# Patient Record
Sex: Female | Born: 1975 | Race: Black or African American | Hispanic: No | Marital: Married | Smoking: Never smoker
Health system: Southern US, Community
[De-identification: ages and names within clinical notes are randomized; demographics above are authoritative.]

## PROBLEM LIST (undated history)

## (undated) DIAGNOSIS — H409 Unspecified glaucoma: Secondary | ICD-10-CM

## (undated) HISTORY — DX: Unspecified glaucoma: H40.9

---

## 2011-12-10 ENCOUNTER — Emergency Department (HOSPITAL_COMMUNITY)
Admission: EM | Admit: 2011-12-10 | Discharge: 2011-12-10 | Disposition: A | Discharge status: Home / Self Care | Payer: Self-pay | Attending: Emergency Medicine | Admitting: Emergency Medicine

## 2011-12-10 ENCOUNTER — Encounter (HOSPITAL_COMMUNITY): Payer: Self-pay | Admitting: Emergency Medicine

## 2011-12-10 DIAGNOSIS — H409 Unspecified glaucoma: Secondary | ICD-10-CM | POA: Insufficient documentation

## 2011-12-10 MED ORDER — FLUORESCEIN SODIUM 1 MG OP STRP
ORAL_STRIP | OPHTHALMIC | Status: AC
  Filled 2011-12-10: qty 1

## 2011-12-10 MED ORDER — TETRACAINE HCL 0.5 % OP SOLN
2.0000 [drp] | Freq: Once | OPHTHALMIC | Status: DC
  Filled 2011-12-10: qty 2

## 2011-12-10 MED ORDER — OXYCODONE-ACETAMINOPHEN 5-325 MG PO TABS
2.0000 | ORAL_TABLET | Freq: Once | ORAL | Status: AC
  Administered 2011-12-10: 2 via ORAL
  Filled 2011-12-10: qty 2

## 2011-12-10 MED ORDER — DORZOLAMIDE HCL-TIMOLOL MAL 2-0.5 % OP SOLN: 1.0000 [drp] | Freq: Two times a day (BID) | OPHTHALMIC | Status: AC

## 2011-12-10 NOTE — Discharge Instructions (Signed)
Glaucoma  Glaucoma is a condition of high pressure inside the eyes. There are several forms of glaucoma. The pressure in the eye is raised because fluid (aqueous) within the eye can not get out through the normal drainage system. Below are the different forms of glaucoma.   Chronic open-angle glaucoma. This is when the fluid in the eye cannot get through the drainage system. It usually affects both eyes. The best way to understand chronic open angle glaucoma is to think of the tires on your car. When they are over-inflated with air, the driver is not aware that there is a problem. However, the rubber on the tires is being worn out faster than normal. Similarly, chronic high pressure in the eyes causes the nerves of the retina and optic nerve to be damaged. When this happens slowly and over time, there are no symptoms.   Closed angle glaucoma or acute angle closure glaucoma. This is when the fluid in the eye cannot get to the drainage system. The pressure in the eye rises very high (3 to 4 times normal). This causes extreme pain and vision loss in the affected eye. Attacks are sudden and often happen in one eye at a time. However, they can happen in both at the same time. If there is an attack of acute angle closure in one eye, the risk of an attack in the other eye is very high.   Primary glaucoma. This term is used when glaucoma is not caused by another disease.  CAUSES   Chronic Open Angle Glaucoma  This form is not usually related to other diseases (primary). However, there are forms of this type of glaucoma that are related to other diseases (secondary) including:   Congenital glaucoma. This means that you are born with the disease.   Certain diseases of the eye (inflammation, some types of cataracts and lens changes, having too much pigment in the eyes, certain diseases of the cornea and tumors of the eye).   Certain drugs and eye drops.   Traumatic injuries.   Abnormal blood vessels that form as a  result of other diseases, especially diabetes.   Certain other diseases in the body.  Acute Angle Closure Glaucoma    Far-sightedness (hyperopia). The eyes are usually shorter in length and have a narrower anatomic opening to the drainage system inside the eye   Drugs that cause dilation of the pupils as a side affect. Always check the label of drugs for warnings about the risk of glaucoma with their use.   Certain eye drops that can enlarge (dilate) the pupils. This is true even with drops used in the doctors office to look at your eyes.  SYMPTOMS   Chronic Open Angle Glaucoma  Early in the disease there are no symptoms at all. If left untreated, the disease will get worse. The following symptoms may happen:    Loss of side vision - usually near the nose. This symptom is rarely noticed by people with chronic open angle glaucoma. By the time this happens, the disease is usually well advanced.   Steady loss of side vision in all directions progressing to "tunnel vision." This is like looking through a toilet roll tube and only being able to see what is in the center of whatever you are looking at.   Eventual total loss of vision in the affected eye(s)  Acute Angle Closure Glaucoma   Severe pain in the affected eye associated with clouded vision.   Severe headache in   the area around the eye.   Feeling sick to your stomach (nausea) and throwing up (vomiting).  DIAGNOSIS   Primary Chronic Open Angle Glaucoma   This form of glaucoma has no symptoms until very late in the disease. Therefore, it is detected only during an eye exam by an eye specialist. It is important to have your eyes looked at by a specialist at least once a year after the age of 40. If the pressure in the eye is high, it does not necessarily mean that there is glaucoma. In such cases, an ophthalmologist may choose to follow the eye pressures carefully over a period of time. They may not begin treatment until other signs of actual damage  appear.   Some of the secondary open angle glaucomas do cause symptoms. In congenital glaucoma for instance, new born babies eyes can be enlarged. The baby may cry all of the time due to the pain of the increased pressure.   Acute Angle Closure Glaucoma   People with this type of glaucoma need to get help right away when the attack comes on because it is so painful. The diagnosis is made by an eye exam by an ophthalmologist. He or she will measure the pressure in the affected eye(s).  TREATMENT   Primary Chronic Open Angle Glaucoma    Eye drops and medicine by mouth (oral), alone or in combination, may be given. This depends on the how bad the disease is and the degree of damage.   Laser treatments may help in more severe cases.   Surgery may be needed.  Acute Angle Closure Glaucoma is treated very aggressively. The longer the attack lasts, the greater the chance of permanent vision loss.    Generally, it is important to get the pressure under control within 24 hours. This is to avoid permanent damage to the affected eye.   Powerful eye drop medication may be used as often as every 10 minutes.   Medicine given by mouth, injection or through the vein (intravenously). These medicines are used to both open the blockage to the eye's drainage system and to lessen the production of the fluid inside the eye.   After pressure is controlled and to avoid future attacks in both the same eye and the unaffected eye, surgery is needed to make a permanent small opening in the colored portion of the eye (iris). This allows the aqueous fluid to have lasting access to the eye's internal drainage system. This surgery can be done in the hospital, or it can be performed in an outpatient setting using a laser.  SEEK MEDICAL CARE IF:    You are over 40 years of age and have never had your eye pressure measured.   You have sudden, severe pain in one eye associated with drop in vision.   You have a headache, feel sick to your  stomach (nausea) and throw up (vomit).  SEEK IMMEDIATE MEDICAL CARE IF:   You develop severe pain in the affected eye.   You have problems with your vision.   You have a bad headache in the area around the eye.   You develop nausea and vomiting.   You have the same or similar symptoms in the other eye.  Document Released: 09/01/2005 Document Revised: 08/21/2011 Document Reviewed: 04/20/2008  [employer] Patient Information 2012 [employer].

## 2011-12-10 NOTE — ED Notes (Signed)
Patient presents stating she has had redness and pain to her left eye for the past 3 days.  Has a history of glaucoma and has just moved here from [place].

## 2011-12-10 NOTE — ED Provider Notes (Signed)
History     CSN: [id number]  Arrival date & time 12/10/11  1811   First MD Initiated Contact with Patient 12/10/11 2151      Chief Complaint  Patient presents with  . Eye Pain    history of glaucoma    (Consider location/radiation/quality/duration/timing/severity/associated sxs/prior treatment) HPI Comments: Patient complains of a 3 day history of redness and worsening pain to her left eye. She is also starting to have some blurry vision in her left eye. She describes a dull, aching, initially 3 days ago, and it's gradually gotten worse since then. 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 This was treated in [place]. She took eyedrops for about 2-3 months and then hasn't needed any treatment for them. She has not had any other eye problems. She complains of a dull bifrontal-type headache and pain and pressure around her left eye. Denies any dizziness denies a numbness or weakness in her extremities or any other complaints.  Patient is a 36 y.o. female presenting with eye pain. The history is provided by the patient.  Eye Pain This is a new problem. The current episode started 2 days ago. Associated symptoms include headaches. Pertinent negatives include no chest pain, no abdominal pain and no shortness of breath.    Past Medical History  Diagnosis Date  . Glaucoma     History reviewed. No pertinent past surgical history.  History reviewed. No pertinent family history.  History  Substance Use Topics  . Smoking status: Never Smoker   . Smokeless tobacco: Not on file  . Alcohol Use: Yes    OB History    Grav Para Term Preterm Abortions TAB SAB Ect Mult Living                  Review of Systems  Constitutional: Negative for fever, chills, diaphoresis and fatigue.  HENT: Negative for congestion, rhinorrhea and sneezing.   Eyes: Positive for pain, redness and visual disturbance.  Respiratory: Negative for cough, chest tightness  and shortness of breath.   Cardiovascular: Negative for chest pain and leg swelling.  Gastrointestinal: Negative for nausea, vomiting, abdominal pain, diarrhea and blood in stool.  Genitourinary: Negative for frequency, hematuria, flank pain and difficulty urinating.  Musculoskeletal: Negative for back pain and arthralgias.  Skin: Negative for rash.  Neurological: Positive for headaches. Negative for dizziness, speech difficulty, weakness and numbness.    Allergies  Review of patient's allergies indicates no known allergies.  Home Medications   Current Outpatient Rx  Name Route Sig Dispense Refill  . DORZOLAMIDE HCL-TIMOLOL MAL 22.3-6.8 MG/ML OP SOLN Left Eye Place 1 drop into the left eye 2 (two) times daily. 10 mL 12    BP 112/75  Pulse 84  Temp(Src) 97.7 F (36.5 C) (Oral)  Resp 20  SpO2 100%  Physical Exam  Constitutional: She is oriented to person, place, and time. She appears well-developed and well-nourished.  HENT:  Head: Normocephalic and atraumatic.  Eyes: Lids are normal. Pupils are equal, round, and reactive to light. Left eye exhibits no discharge and no exudate. No foreign body present in the left eye. Left conjunctiva is injected. Left eye exhibits normal extraocular motion and no nystagmus.  Slit lamp exam:      The left eye shows no corneal abrasion, no corneal ulcer, no foreign body, no hyphema and no fluorescein uptake.       Left eye is red, pressure with tonopen was 28  Neck: Normal range of motion. Neck supple.  Cardiovascular: Normal rate, regular rhythm and normal heart sounds.   Pulmonary/Chest: Effort normal and breath sounds normal. No respiratory distress. She has no wheezes. She has no rales. She exhibits no tenderness.  Abdominal: Soft. Bowel sounds are normal. There is no tenderness. There is no rebound and no guarding.  Musculoskeletal: Normal range of motion. She exhibits no edema.  Lymphadenopathy:    She has no cervical adenopathy.    Neurological: She is alert and oriented to person, place, and time.  Skin: Skin is warm and dry. No rash noted.  Psychiatric: She has a normal mood and affect.    ED Course  Procedures (including critical care time)  Labs Reviewed - No data to display No results found.   1. Glaucoma       MDM  Spoke with Dr. Allyne Gee with ophthamology who recommends starting cosopt eye drops and he will see her in the office tomorrow.        Rolan Bucco, MD 12/10/11 [phone]

## 2011-12-10 NOTE — ED Notes (Signed)
History of glaucoma reports pain onset 3 days ago

## 2012-03-24 ENCOUNTER — Encounter: Payer: Self-pay | Admitting: Obstetrics and Gynecology

## 2013-08-08 ENCOUNTER — Ambulatory Visit: Payer: Commercial Managed Care - PPO | Attending: Internal Medicine

## 2016-12-30 ENCOUNTER — Ambulatory Visit (INDEPENDENT_AMBULATORY_CARE_PROVIDER_SITE_OTHER): Payer: Self-pay | Admitting: Physician Assistant

## 2017-04-28 ENCOUNTER — Other Ambulatory Visit: Payer: Self-pay | Admitting: Surgery

## 2017-04-28 DIAGNOSIS — G453 Amaurosis fugax: Secondary | ICD-10-CM

## 2017-05-05 ENCOUNTER — Ambulatory Visit
Admission: RE | Admit: 2017-05-05 | Discharge: 2017-05-05 | Disposition: A | Discharge status: Home / Self Care | Payer: Self-pay | Source: Ambulatory Visit | Attending: Surgery | Admitting: Surgery

## 2017-05-05 DIAGNOSIS — G453 Amaurosis fugax: Secondary | ICD-10-CM

## 2017-05-05 MED ORDER — GADOBENATE DIMEGLUMINE 529 MG/ML IV SOLN
13.0000 mL | Freq: Once | INTRAVENOUS | Status: AC | PRN
  Administered 2017-05-05: 13 mL via INTRAVENOUS

## 2017-10-07 ENCOUNTER — Other Ambulatory Visit: Payer: Self-pay

## 2017-10-07 ENCOUNTER — Emergency Department (HOSPITAL_COMMUNITY)
Admission: EM | Admit: 2017-10-07 | Discharge: 2017-10-08 | Disposition: A | Discharge status: Home / Self Care | Payer: BLUE CROSS/BLUE SHIELD | Attending: Emergency Medicine | Admitting: Emergency Medicine

## 2017-10-07 ENCOUNTER — Encounter (HOSPITAL_COMMUNITY): Payer: Self-pay | Admitting: Emergency Medicine

## 2017-10-07 ENCOUNTER — Emergency Department (HOSPITAL_COMMUNITY): Payer: BLUE CROSS/BLUE SHIELD

## 2017-10-07 ENCOUNTER — Ambulatory Visit (HOSPITAL_COMMUNITY)
Admission: EM | Admit: 2017-10-07 | Discharge: 2017-10-07 | Disposition: A | Discharge status: Home / Self Care | Payer: BLUE CROSS/BLUE SHIELD | Attending: Family Medicine | Admitting: Family Medicine

## 2017-10-07 DIAGNOSIS — R1013 Epigastric pain: Secondary | ICD-10-CM | POA: Diagnosis not present

## 2017-10-07 DIAGNOSIS — R079 Chest pain, unspecified: Secondary | ICD-10-CM

## 2017-10-07 DIAGNOSIS — R0600 Dyspnea, unspecified: Secondary | ICD-10-CM

## 2017-10-07 DIAGNOSIS — R0609 Other forms of dyspnea: Secondary | ICD-10-CM | POA: Diagnosis not present

## 2017-10-07 LAB — BASIC METABOLIC PANEL
Anion gap: 11 (ref 5–15)
BUN: 13 mg/dL (ref 6–20)
CHLORIDE: 104 mmol/L (ref 101–111)
CO2: 24 mmol/L (ref 22–32)
Calcium: 9.3 mg/dL (ref 8.9–10.3)
Creatinine, Ser: 0.97 mg/dL (ref 0.44–1.00)
GFR calc Af Amer: >60 mL/min (ref >60)
GFR calc non Af Amer: >60 mL/min (ref >60)
GLUCOSE: 87 mg/dL (ref 65–99)
POTASSIUM: 4 mmol/L (ref 3.5–5.1)
Sodium: 139 mmol/L (ref 135–145)

## 2017-10-07 LAB — CBC
HEMATOCRIT: 43.2 % (ref 36.0–46.0)
Hemoglobin: 14.4 g/dL (ref 12.0–15.0)
MCH: 27.9 pg (ref 26.0–34.0)
MCHC: 33.3 g/dL (ref 30.0–36.0)
MCV: 83.7 fL (ref 78.0–100.0)
Platelets: 312 10*3/uL (ref 150–400)
RBC: 5.16 MIL/uL — ABNORMAL HIGH (ref 3.87–5.11)
RDW: 12.4 % (ref 11.5–15.5)
WBC: 6.8 10*3/uL (ref 4.0–10.5)

## 2017-10-07 LAB — I-STAT TROPONIN, ED: Troponin i, poc: 0 ng/mL (ref 0.00–0.08)

## 2017-10-07 LAB — I-STAT BETA HCG BLOOD, ED (MC, WL, AP ONLY)

## 2017-10-07 MED ORDER — ASPIRIN 81 MG PO CHEW
324.0000 mg | CHEWABLE_TABLET | Freq: Once | ORAL | Status: AC
  Administered 2017-10-07: 324 mg via ORAL

## 2017-10-07 MED ORDER — ASPIRIN 81 MG PO CHEW
CHEWABLE_TABLET | ORAL | Status: AC
  Filled 2017-10-07: qty 4

## 2017-10-07 NOTE — ED Notes (Signed)
Pt remains in waiting room. Updated on wait for treatment room. 

## 2017-10-07 NOTE — ED Notes (Signed)
Lab work, radiology results and vital signs reviewed, no critical results at this time, no change in acuity indicated.  

## 2017-10-07 NOTE — ED Provider Notes (Signed)
[facility]    CSN: [id number] Arrival date & time: 10/07/17  1731     History   Chief Complaint Chief Complaint  Patient presents with  . Chest Pain    HPI Amber Robinson is a 42 y.o. female.   42 year old female, with no significant past medical history, presenting today complaining of chest pain.  Patient states that she has had intermittent chest pain over the past 3 days.  States the chest pain is substernal and radiates through to her back.  States that she has some intermittent shortness of breath as well as nausea associated with the pain.  Has also noticed dyspnea on exertion over the past 3 days.  She denies any leg swelling, hemoptysis, palpitations.   The history is provided by the patient.  Chest Pain  Pain location:  Substernal area Pain quality: aching   Pain radiates to:  Mid back Pain severity:  Moderate Onset quality:  Gradual Duration:  3 days Timing:  Intermittent Progression:  Unchanged Chronicity:  New Context: at rest   Context: not breathing, not drug use and not eating   Relieved by:  Nothing Worsened by:  Nothing Ineffective treatments:  None tried Associated symptoms: abdominal pain (chest pain radiates to the epigastric region), back pain, dizziness, nausea and shortness of breath   Associated symptoms: no AICD problem, no altered mental status, no anorexia, no anxiety, no claudication, no cough, no diaphoresis, no dysphagia, no fatigue, no fever, no headache, no heartburn, no lower extremity edema, no numbness, no orthopnea, no palpitations, no PND, no syncope, no vomiting and no weakness   Risk factors: no birth control, no coronary artery disease, no diabetes mellitus, no Ehlers-Danlos syndrome, no high cholesterol, no hypertension, no immobilization, not female and no Marfan's syndrome     Past Medical History:  Diagnosis Date  . Glaucoma     There are no active problems to display for this patient.   History reviewed.  No pertinent surgical history.  OB History    No data available       Home Medications    Prior to Admission medications   Not on File    Family History History reviewed. No pertinent family history.  Social History Social History   Tobacco Use  . Smoking status: Never Smoker  . Smokeless tobacco: Never Used  Substance Use Topics  . Alcohol use: Yes  . Drug use: No     Allergies   Patient has no known allergies.   Review of Systems Review of Systems  Constitutional: Negative for chills, diaphoresis, fatigue and fever.  HENT: Negative for ear pain, sore throat and trouble swallowing.   Eyes: Negative for pain and visual disturbance.  Respiratory: Positive for shortness of breath. Negative for cough.   Cardiovascular: Positive for chest pain. Negative for palpitations, orthopnea, claudication, syncope and PND.  Gastrointestinal: Positive for abdominal pain (chest pain radiates to the epigastric region) and nausea. Negative for anorexia, heartburn and vomiting.  Genitourinary: Negative for dysuria and hematuria.  Musculoskeletal: Positive for back pain. Negative for arthralgias.  Skin: Negative for color change and rash.  Neurological: Positive for dizziness. Negative for seizures, syncope, weakness, numbness and headaches.  All other systems reviewed and are negative.    Physical Exam Triage Vital Signs ED Triage Vitals [10/07/17 1753]  Enc Vitals Group     BP 114/78     Pulse Rate 78     Resp 20     Temp 97.6 F (36.4  C)     Temp Source Oral     SpO2 100 %     Weight      Height      Head Circumference      Peak Flow      Pain Score      Pain Loc      Pain Edu?      Excl. in [place]?    No data found.  Updated Vital Signs BP 114/78 (BP Location: Left Arm)   Pulse 78   Temp 97.6 F (36.4 C) (Oral)   Resp 20   LMP 09/30/2017   SpO2 100%   Visual Acuity Right Eye Distance:   Left Eye Distance:   Bilateral Distance:    Right Eye Near:   Left  Eye Near:    Bilateral Near:     Physical Exam  Constitutional: She appears well-developed and well-nourished. No distress.  HENT:  Head: Normocephalic and atraumatic.  Eyes: Conjunctivae are normal.  Neck: Neck supple.  Cardiovascular: Normal rate, regular rhythm and normal pulses.  No extrasystoles are present. PMI is not displaced.  No murmur heard. Pulmonary/Chest: Effort normal and breath sounds normal. No stridor. No respiratory distress. She has no decreased breath sounds. She has no wheezes. She has no rhonchi. She has no rales.  Abdominal: Soft. There is no tenderness.  Musculoskeletal: She exhibits no edema.  Neurological: She is alert.  Skin: Skin is warm and dry.  Psychiatric: She has a normal mood and affect.  Nursing note and vitals reviewed.    UC Treatments / Results  Labs (all labs ordered are listed, but only abnormal results are displayed) Labs Reviewed - No data to display  EKG  EKG Interpretation None       Radiology No results found.  Procedures Procedures (including critical care time)  Medications Ordered in UC Medications  aspirin chewable tablet 324 mg (324 mg Oral Given 10/07/17 1807)     Initial Impression / Assessment and Plan / UC Course  I have reviewed the triage vital signs and the nursing notes.  Pertinent labs & imaging results that were available during my care of the patient were reviewed by me and considered in my medical decision making (see chart for details).     Chest pain with shortness of breath and nausea EKG shows NSR Patient sent to the ED for further eval   Final Clinical Impressions(s) / UC Diagnoses   Final diagnoses:  Chest pain, unspecified type  DOE (dyspnea on exertion)    ED Discharge Orders    None       Controlled Substance Prescriptions [place] Controlled Substance Registry consulted? Not Applicable   Phebe Colla, [place] 10/07/17 7948

## 2017-10-07 NOTE — ED Triage Notes (Signed)
Pt to ER sent from [facility] for further cardiac evaluation - states lower central chest pain radiating to back x3 days with dizziness. States palpitations in throat. NAD. No medical hx.

## 2017-10-07 NOTE — ED Notes (Signed)
Updated on room status.

## 2017-10-07 NOTE — ED Triage Notes (Signed)
PT C/O: intermittent CP/epigastic pain onset 3 days that radiates to the back.... Reports sx increases when slouching over  Sx also include headaches, blurred vision, nauseas   Had an ekg last month due to blurred vision  DENIES: diaphoresis, dyspnea/SOB... Also denies pain at the moment.   TAKING MEDS: advil   A&O x4... NAD... Ambulatory

## 2017-10-08 LAB — HEPATIC FUNCTION PANEL
ALT: 11 U/L — ABNORMAL LOW (ref 14–54)
AST: 17 U/L (ref 15–41)
Albumin: 4 g/dL (ref 3.5–5.0)
Alkaline Phosphatase: 60 U/L (ref 38–126)
Bilirubin, Direct: <0.1 mg/dL — ABNORMAL LOW (ref 0.1–0.5)
TOTAL PROTEIN: 7.1 g/dL (ref 6.5–8.1)
Total Bilirubin: 0.4 mg/dL (ref 0.3–1.2)

## 2017-10-08 LAB — LIPASE, BLOOD: LIPASE: 29 U/L (ref 11–51)

## 2017-10-08 MED ORDER — GI COCKTAIL ~~LOC~~
30.0000 mL | Freq: Once | ORAL | Status: AC
  Administered 2017-10-08: 30 mL via ORAL
  Filled 2017-10-08: qty 30

## 2017-10-08 MED ORDER — SUCRALFATE 1 G PO TABS: 1.0000 g | ORAL_TABLET | Freq: Three times a day (TID) | ORAL | 0 refills | Status: DC

## 2017-10-08 MED ORDER — OMEPRAZOLE 20 MG PO CPDR: 20.0000 mg | DELAYED_RELEASE_CAPSULE | Freq: Every day | ORAL | 0 refills | Status: DC

## 2017-10-08 NOTE — ED Provider Notes (Signed)
[facility] EMERGENCY DEPARTMENT Provider Note   CSN: [id number] Arrival date & time: 10/07/17  1825     History   Chief Complaint Chief Complaint  Patient presents with  . Chest Pain    HPI Amber Robinson is a 42 y.o. female.  Patient presents to the ED with a chief complaint of epigastric pain.  Onset 3 days ago. She states that she has had some burning in her throat and chest.  She states that it also radiates to her back.  She denies any post-prandial pain.  She states that she has had some SOB.  She denies smoking or eating spicy or greasy foods.  She states that she does drink coffee daily.  She tried taking some TUMs with no relief.  Additionally, she reports that she has had some vertigo symptoms, for which she is being worked up by her doctor and recently had a negative MRI.   The history is provided by the patient. No language interpreter was used.    Past Medical History:  Diagnosis Date  . Glaucoma     There are no active problems to display for this patient.   History reviewed. No pertinent surgical history.  OB History    No data available       Home Medications    Prior to Admission medications   Not on File    Family History History reviewed. No pertinent family history.  Social History Social History   Tobacco Use  . Smoking status: Never Smoker  . Smokeless tobacco: Never Used  Substance Use Topics  . Alcohol use: Yes  . Drug use: No     Allergies   Patient has no known allergies.   Review of Systems Review of Systems  All other systems reviewed and are negative.    Physical Exam Updated Vital Signs BP 102/74   Pulse 70   Temp 98.4 F (36.9 C) (Oral)   Resp 16   LMP 09/30/2017   SpO2 100%   Physical Exam  Constitutional: She is oriented to person, place, and time. She appears well-developed and well-nourished.  HENT:  Head: Normocephalic and atraumatic.  Eyes: Conjunctivae and EOM are  normal. Pupils are equal, round, and reactive to light.  Neck: Normal range of motion. Neck supple.  Cardiovascular: Normal rate and regular rhythm. Exam reveals no gallop and no friction rub.  No murmur heard. Pulmonary/Chest: Effort normal and breath sounds normal. No respiratory distress. She has no wheezes. She has no rales. She exhibits no tenderness.  Abdominal: Soft. Bowel sounds are normal. She exhibits no distension and no mass. There is no tenderness. There is no rebound and no guarding.  No focal abdominal tenderness, no RLQ tenderness or pain at McBurney's point, no RUQ tenderness or Murphy's sign, no left-sided abdominal tenderness, no fluid wave, or signs of peritonitis   Musculoskeletal: Normal range of motion. She exhibits no edema or tenderness.  Neurological: She is alert and oriented to person, place, and time.  Skin: Skin is warm and dry.  Psychiatric: She has a normal mood and affect. Her behavior is normal. Judgment and thought content normal.  Nursing note and vitals reviewed.    ED Treatments / Results  Labs (all labs ordered are listed, but only abnormal results are displayed) Labs Reviewed  CBC - Abnormal; Notable for the following components:      Result Value   RBC 5.16 (*)    All other components within normal limits  BASIC METABOLIC PANEL  LIPASE, BLOOD  HEPATIC FUNCTION PANEL  I-STAT TROPONIN, ED  I-STAT BETA HCG BLOOD, ED ([facility], [facility], [facility] ONLY)    EKG  EKG Interpretation None      ED ECG REPORT  I personally interpreted this EKG   Date: 10/08/2017   Rate: 79  Rhythm: normal sinus rhythm  QRS Axis: right  Intervals: normal  ST/T Wave abnormalities: normal  Conduction Disutrbances:none  Narrative Interpretation:   Old EKG Reviewed: none available   Radiology Dg Chest 2 View  Result Date: 10/07/2017 CLINICAL DATA:  42 year old female with a history of epigastric and chest pain EXAM: CHEST  2 VIEW COMPARISON:  None. FINDINGS: The heart size  and mediastinal contours are within normal limits. Both lungs are clear. The visualized skeletal structures are unremarkable. IMPRESSION: No radiographic evidence of acute cardiopulmonary disease Electronically Signed   By: Corrie Mckusick D.O.   On: 10/07/2017 19:20    Procedures Procedures (including critical care time)  Medications Ordered in ED Medications  gi cocktail (Maalox,Lidocaine,Donnatal) (not administered)     Initial Impression / Assessment and Plan / ED Course  I have reviewed the triage vital signs and the nursing notes.  Pertinent labs & imaging results that were available during my care of the patient were reviewed by me and considered in my medical decision making (see chart for details).    Patient with epigastric pain and pain in her chest that radiates to her back.  This is been ongoing for several days.  She describes this as a burning sensation.  She tried taking Tums without relief.  Laboratory workup is reassuring.  Troponin is negative.  Electrolytes and blood counts are normal.  Chest x-ray is negative.  I have a low suspicion for ACS, or PE.    Vital signs are stable.  She is not tachycardic nor hypoxic.  Final Clinical Impressions(s) / ED Diagnoses   Final diagnoses:  Epigastric pain    ED Discharge Orders        Ordered    omeprazole (PRILOSEC) 20 MG capsule  Daily     10/08/17 0110    sucralfate (CARAFATE) 1 g tablet  3 times daily with meals & bedtime     10/08/17 0110       Montine Circle, PA-C 10/08/17 0622    Ward, Delice Bison, DO 10/08/17 [phone]

## 2020-06-25 DIAGNOSIS — Z01419 Encounter for gynecological examination (general) (routine) without abnormal findings: Secondary | ICD-10-CM | POA: Diagnosis not present

## 2020-06-25 DIAGNOSIS — Z0000 Encounter for general adult medical examination without abnormal findings: Secondary | ICD-10-CM | POA: Diagnosis not present

## 2020-06-25 DIAGNOSIS — Z124 Encounter for screening for malignant neoplasm of cervix: Secondary | ICD-10-CM | POA: Diagnosis not present

## 2020-06-25 DIAGNOSIS — Z30431 Encounter for routine checking of intrauterine contraceptive device: Secondary | ICD-10-CM | POA: Diagnosis not present

## 2020-06-25 DIAGNOSIS — Z8739 Personal history of other diseases of the musculoskeletal system and connective tissue: Secondary | ICD-10-CM | POA: Diagnosis not present

## 2020-06-25 DIAGNOSIS — Z1151 Encounter for screening for human papillomavirus (HPV): Secondary | ICD-10-CM | POA: Diagnosis not present

## 2020-08-03 DIAGNOSIS — Z1231 Encounter for screening mammogram for malignant neoplasm of breast: Secondary | ICD-10-CM | POA: Diagnosis not present

## 2021-01-21 ENCOUNTER — Encounter: Payer: Self-pay | Admitting: Internal Medicine

## 2021-01-21 ENCOUNTER — Ambulatory Visit (INDEPENDENT_AMBULATORY_CARE_PROVIDER_SITE_OTHER): Payer: 59 | Admitting: Internal Medicine

## 2021-01-21 ENCOUNTER — Other Ambulatory Visit: Payer: Self-pay

## 2021-01-21 VITALS — BP 110/70 | HR 89 | Temp 99.2°F | Ht 64.0 in | Wt 161.2 lb

## 2021-01-21 DIAGNOSIS — Z0001 Encounter for general adult medical examination with abnormal findings: Secondary | ICD-10-CM | POA: Diagnosis not present

## 2021-01-21 DIAGNOSIS — K219 Gastro-esophageal reflux disease without esophagitis: Secondary | ICD-10-CM | POA: Diagnosis not present

## 2021-01-21 DIAGNOSIS — Z0000 Encounter for general adult medical examination without abnormal findings: Secondary | ICD-10-CM | POA: Insufficient documentation

## 2021-01-21 LAB — COMPREHENSIVE METABOLIC PANEL
ALT: 10 U/L (ref 0–35)
AST: 19 U/L (ref 0–37)
Albumin: 4.4 g/dL (ref 3.5–5.2)
Alkaline Phosphatase: 73 U/L (ref 39–117)
BUN: 11 mg/dL (ref 6–23)
CO2: 26 mEq/L (ref 19–32)
Calcium: 9.4 mg/dL (ref 8.4–10.5)
Chloride: 105 mEq/L (ref 96–112)
Creatinine, Ser: 0.77 mg/dL (ref 0.40–1.20)
GFR: 94.04 mL/min (ref >60.00)
Glucose, Bld: 77 mg/dL (ref 70–99)
Potassium: 3.9 mEq/L (ref 3.5–5.1)
Sodium: 137 mEq/L (ref 135–145)
Total Bilirubin: 0.3 mg/dL (ref 0.2–1.2)
Total Protein: 7.5 g/dL (ref 6.0–8.3)

## 2021-01-21 LAB — CBC
HCT: 43 % (ref 36.0–46.0)
Hemoglobin: 14.2 g/dL (ref 12.0–15.0)
MCHC: 33 g/dL (ref 30.0–36.0)
MCV: 83 fl (ref 78.0–100.0)
Platelets: 270 10*3/uL (ref 150.0–400.0)
RBC: 5.18 Mil/uL — ABNORMAL HIGH (ref 3.87–5.11)
RDW: 13.5 % (ref 11.5–15.5)
WBC: 6.5 10*3/uL (ref 4.0–10.5)

## 2021-01-21 LAB — LIPID PANEL
Cholesterol: 135 mg/dL (ref 0–200)
HDL: 45.1 mg/dL (ref >39.00)
LDL Cholesterol: 79 mg/dL (ref 0–99)
NonHDL: 90.31
Total CHOL/HDL Ratio: 3
Triglycerides: 56 mg/dL (ref 0.0–149.0)
VLDL: 11.2 mg/dL (ref 0.0–40.0)

## 2021-01-21 LAB — HEMOGLOBIN A1C: Hgb A1c MFr Bld: 5.2 % (ref 4.6–6.5)

## 2021-01-21 LAB — TSH: TSH: 1.69 u[IU]/mL (ref 0.35–4.50)

## 2021-01-21 MED ORDER — FAMOTIDINE 40 MG PO TABS: 40.0000 mg | ORAL_TABLET | Freq: Every day | ORAL | 3 refills | Status: DC

## 2021-01-21 NOTE — Assessment & Plan Note (Signed)
Flu shot counseled yearly. Covid-19 3 shots. P. Tetanus up to date. Mammogram up to date with gyn, pap smear up to date with gyn. Counseled about sun safety and mole surveillance. Counseled about the dangers of distracted driving. Given 10 year screening recommendations.

## 2021-01-21 NOTE — Patient Instructions (Addendum)
We have sent in pepcid to take daily for 2 weeks and then see how your stomach is doing.    Health Maintenance, Female Adopting a healthy lifestyle and getting preventive care are important in promoting health and wellness. Ask your health care provider about:  The right schedule for you to have regular tests and exams.  Things you can do on your own to prevent diseases and keep yourself healthy. What should I know about diet, weight, and exercise? Eat a healthy diet  Eat a diet that includes plenty of vegetables, fruits, low-fat dairy products, and lean protein.  Do not eat a lot of foods that are high in solid fats, added sugars, or sodium.   Maintain a healthy weight Body mass index (BMI) is used to identify weight problems. It estimates body fat based on height and weight. Your health care provider can help determine your BMI and help you achieve or maintain a healthy weight. Get regular exercise Get regular exercise. This is one of the most important things you can do for your health. Most adults should:  Exercise for at least 150 minutes each week. The exercise should increase your heart rate and make you sweat (moderate-intensity exercise).  Do strengthening exercises at least twice a week. This is in addition to the moderate-intensity exercise.  Spend less time sitting. Even light physical activity can be beneficial. Watch cholesterol and blood lipids Have your blood tested for lipids and cholesterol at 44 years of age, then have this test every 5 years. Have your cholesterol levels checked more often if:  Your lipid or cholesterol levels are high.  You are older than 44 years of age.  You are at high risk for heart disease. What should I know about cancer screening? Depending on your health history and family history, you may need to have cancer screening at various ages. This may include screening for:  Breast cancer.  Cervical cancer.  Colorectal cancer.  Skin  cancer.  Lung cancer. What should I know about heart disease, diabetes, and high blood pressure? Blood pressure and heart disease  High blood pressure causes heart disease and increases the risk of stroke. This is more likely to develop in people who have high blood pressure readings, are of African descent, or are overweight.  Have your blood pressure checked: ? Every 3-5 years if you are 26-83 years of age. ? Every year if you are 61 years old or older. Diabetes Have regular diabetes screenings. This checks your fasting blood sugar level. Have the screening done:  Once every three years after age 64 if you are at a normal weight and have a low risk for diabetes.  More often and at a younger age if you are overweight or have a high risk for diabetes. What should I know about preventing infection? Hepatitis B If you have a higher risk for hepatitis B, you should be screened for this virus. Talk with your health care provider to find out if you are at risk for hepatitis B infection. Hepatitis C Testing is recommended for:  Everyone born from [birth year] through 1964.  Anyone with known risk factors for hepatitis C. Sexually transmitted infections (STIs)  Get screened for STIs, including gonorrhea and chlamydia, if: ? You are sexually active and are younger than 44 years of age. ? You are older than 44 years of age and your health care provider tells you that you are at risk for this type of infection. ? Your sexual activity  has changed since you were last screened, and you are at increased risk for chlamydia or gonorrhea. Ask your health care provider if you are at risk.  Ask your health care provider about whether you are at high risk for HIV. Your health care provider may recommend a prescription medicine to help prevent HIV infection. If you choose to take medicine to prevent HIV, you should first get tested for HIV. You should then be tested every 3 months for as long as you are taking  the medicine. Pregnancy  If you are about to stop having your period (premenopausal) and you may become pregnant, seek counseling before you get pregnant.  Take 400 to 800 micrograms (mcg) of folic acid every day if you become pregnant.  Ask for birth control (contraception) if you want to prevent pregnancy. Osteoporosis and menopause Osteoporosis is a disease in which the bones lose minerals and strength with aging. This can result in bone fractures. If you are 67 years old or older, or if you are at risk for osteoporosis and fractures, ask your health care provider if you should:  Be screened for bone loss.  Take a calcium or vitamin D supplement to lower your risk of fractures.  Be given hormone replacement therapy (HRT) to treat symptoms of menopause. Follow these instructions at home: Lifestyle  Do not use any products that contain nicotine or tobacco, such as cigarettes, e-cigarettes, and chewing tobacco. If you need help quitting, ask your health care provider.  Do not use street drugs.  Do not share needles.  Ask your health care provider for help if you need support or information about quitting drugs. Alcohol use  Do not drink alcohol if: ? Your health care provider tells you not to drink. ? You are pregnant, may be pregnant, or are planning to become pregnant.  If you drink alcohol: ? Limit how much you use to 0-1 drink a day. ? Limit intake if you are breastfeeding.  Be aware of how much alcohol is in your drink. In the [place] one drink equals one 12 oz bottle of beer (355 mL), one 5 oz glass of wine (148 mL), or one 1 oz glass of hard liquor (44 mL). General instructions  Schedule regular health, dental, and eye exams.  Stay current with your vaccines.  Tell your health care provider if: ? You often feel depressed. ? You have ever been abused or do not feel safe at home. Summary  Adopting a healthy lifestyle and getting preventive care are important in  promoting health and wellness.  Follow your health care provider's instructions about healthy diet, exercising, and getting tested or screened for diseases.  Follow your health care provider's instructions on monitoring your cholesterol and blood pressure. This information is not intended to replace advice given to you by your health care provider. Make sure you discuss any questions you have with your health care provider. Document Revised: 08/25/2018 Document Reviewed: 08/25/2018 Elsevier Patient Education  2021 [employer].

## 2021-01-21 NOTE — Assessment & Plan Note (Signed)
Rx pepcid and checking CBC to ensure no blood loss. If no improvement can consider referral to GI for EGD.

## 2021-01-21 NOTE — Progress Notes (Signed)
   Subjective:   Patient ID: Amber Robinson, female    DOB: March 09, 1976, 45 y.o.   MRN: [id number]  HPI The patient is a 45 YO female coming in new for physical. Having some GERD symptoms treated a few years ago with 2-3 weeks of omeprazole which resolved symptoms, not daily but several times a week. Using herbal tea which helps some.   PMH, [facility], social history reviewed and updated  Review of Systems  Constitutional: Negative.   HENT: Negative.   Eyes: Negative.   Respiratory: Negative for cough, chest tightness and shortness of breath.   Cardiovascular: Negative for chest pain, palpitations and leg swelling.  Gastrointestinal: Negative for abdominal distention, abdominal pain, constipation, diarrhea, nausea and vomiting.       GERD  Musculoskeletal: Negative.   Skin: Negative.   Neurological: Negative.   Psychiatric/Behavioral: Negative.     Objective:  Physical Exam Constitutional:      Appearance: She is well-developed.  HENT:     Head: Normocephalic and atraumatic.  Cardiovascular:     Rate and Rhythm: Normal rate and regular rhythm.  Pulmonary:     Effort: Pulmonary effort is normal. No respiratory distress.     Breath sounds: Normal breath sounds. No wheezing or rales.  Abdominal:     General: Bowel sounds are normal. There is no distension.     Palpations: Abdomen is soft.     Tenderness: There is no abdominal tenderness. There is no rebound.  Musculoskeletal:     Cervical back: Normal range of motion.  Skin:    General: Skin is warm and dry.  Neurological:     Mental Status: She is alert and oriented to person, place, and time.     Coordination: Coordination normal.     Vitals:   01/21/21 1040  BP: 110/70  Pulse: 89  Temp: 99.2 F (37.3 C)  TempSrc: Oral  SpO2: 98%  Weight: 161 lb 3.2 oz (73.1 kg)  Height: 5' 4"  (1.626 m)    This visit occurred during the SARS-CoV-2 public health emergency.  Safety protocols were in place, including screening  questions prior to the visit, additional usage of staff PPE, and extensive cleaning of exam room while observing appropriate contact time as indicated for disinfecting solutions.   Assessment & Plan:

## 2021-01-23 ENCOUNTER — Encounter: Payer: Self-pay | Admitting: *Deleted

## 2021-09-24 ENCOUNTER — Encounter: Payer: Self-pay | Admitting: Internal Medicine

## 2022-01-06 ENCOUNTER — Ambulatory Visit (INDEPENDENT_AMBULATORY_CARE_PROVIDER_SITE_OTHER): Payer: 59 | Admitting: Internal Medicine

## 2022-01-06 ENCOUNTER — Encounter: Payer: Self-pay | Admitting: Internal Medicine

## 2022-01-06 ENCOUNTER — Ambulatory Visit (INDEPENDENT_AMBULATORY_CARE_PROVIDER_SITE_OTHER): Payer: 59

## 2022-01-06 VITALS — BP 122/80 | HR 97 | Temp 98.0°F | Ht 64.0 in | Wt 164.0 lb

## 2022-01-06 DIAGNOSIS — M546 Pain in thoracic spine: Secondary | ICD-10-CM

## 2022-01-06 MED ORDER — METHOCARBAMOL 500 MG PO TABS: 500.0000 mg | ORAL_TABLET | Freq: Every evening | ORAL | 0 refills | Status: DC | PRN

## 2022-01-06 NOTE — Progress Notes (Signed)
? ? ?  Subjective:  ? ? Patient ID: Amber Robinson, female    DOB: 04/22/1976, 46 y.o.   MRN: [id number] ? ?This visit occurred during the SARS-CoV-2 public health emergency.  Safety protocols were in place, including screening questions prior to the visit, additional usage of staff PPE, and extensive cleaning of exam room while observing appropriate contact time as indicated for disinfecting solutions. ? ? ? ?HPI ?Amber Robinson is here for  ?Chief Complaint  ?Patient presents with  ? Neck Pain  ?  Neck and back pain  ? ? ? ?She has had neck-mid back pain on and off for years and it got worse last Tuesday.  She has pain from her neck to her middle back  - it is along the spine  the pain is worse with movement and getting up after sitting or laying for a while.  The pain feels deep-not superficial muscles.  Taking tylenol. It does help some.  ? ?She tried stretching/doing yoga and it made it worse.  ? ?No prior injuries.  No muscle spasms/cramping.  No n/t.   No headaches, pain in arms.  ? ? ? ?Medications and allergies reviewed with patient and updated if appropriate. ? ?Current Outpatient Medications on File Prior to Visit  ?Medication Sig Dispense Refill  ? famotidine (PEPCID) 40 MG tablet Take 1 tablet (40 mg total) by mouth daily. 30 tablet 3  ? ?No current facility-administered medications on file prior to visit.  ? ? ?Review of Systems ? ?   ?Objective:  ? ?Vitals:  ? 01/06/22 0812  ?BP: 122/80  ?Pulse: 97  ?Temp: 98 ?F (36.7 ?C)  ?SpO2: 99%  ? ?BP Readings from Last 3 Encounters:  ?01/06/22 122/80  ?01/21/21 110/70  ?10/08/17 105/68  ? ?Wt Readings from Last 3 Encounters:  ?01/06/22 164 lb (74.4 kg)  ?01/21/21 161 lb 3.2 oz (73.1 kg)  ? ?Body mass index is 28.15 kg/m?. ? ?  ?Physical Exam ?Constitutional:   ?   General: She is not in acute distress. ?   Appearance: Normal appearance.  ?HENT:  ?   Head: Normocephalic.  ?Musculoskeletal:     ?   General: No swelling, tenderness (No tenderness to palpation cervical  spine, thoracic spine or paravertebral muscles) or deformity.  ?   Right lower leg: No edema.  ?   Left lower leg: No edema.  ?Skin: ?   General: Skin is warm and dry.  ?Neurological:  ?   Mental Status: She is alert.  ?   Cranial Nerves: No cranial nerve deficit.  ?   Sensory: No sensory deficit.  ?   Motor: No weakness.  ?   Gait: Gait normal.  ? ?   ? ? ? ? ? ?Assessment & Plan:  ? ? ?Midline cervical-thoracic back pain: ?Chronic pain-has had pain intermittently for years, but has gotten worse in the past week ?No radiculopathy ?Will check x-rays of cervical and thoracic spine ?Would prefer not to take any anti-inflammatories because of stomach issues ?Continue Tylenol as needed ?Trial of methocarbamol 500 mg at bedtime ?Will refer to sports medicine for further evaluation-?  Benefit from possible adjustment versus physical therapy ? ? ? ? ?

## 2022-01-06 NOTE — Patient Instructions (Addendum)
? ? ?  Xrays of your spine were ordered.   ? ? ?Medications changes include :   methocarbamol 500 mg at night ? ? ?Your prescription(s) have been sent to your pharmacy.  ? ? ?A referral was ordered for sports medicine.     Someone from that office will call you to schedule an appointment.  ? ? ?Return if symptoms worsen or fail to improve. ? ?

## 2022-01-27 ENCOUNTER — Ambulatory Visit (INDEPENDENT_AMBULATORY_CARE_PROVIDER_SITE_OTHER): Payer: 59 | Admitting: Internal Medicine

## 2022-01-27 ENCOUNTER — Encounter: Payer: Self-pay | Admitting: Internal Medicine

## 2022-01-27 VITALS — BP 108/70 | HR 92 | Resp 18 | Ht 64.0 in | Wt 164.6 lb

## 2022-01-27 DIAGNOSIS — Z7184 Encounter for health counseling related to travel: Secondary | ICD-10-CM

## 2022-01-27 DIAGNOSIS — K219 Gastro-esophageal reflux disease without esophagitis: Secondary | ICD-10-CM | POA: Diagnosis not present

## 2022-01-27 DIAGNOSIS — M546 Pain in thoracic spine: Secondary | ICD-10-CM

## 2022-01-27 DIAGNOSIS — Z1211 Encounter for screening for malignant neoplasm of colon: Secondary | ICD-10-CM

## 2022-01-27 DIAGNOSIS — Z0001 Encounter for general adult medical examination with abnormal findings: Secondary | ICD-10-CM | POA: Diagnosis not present

## 2022-01-27 MED ORDER — ATOVAQUONE-PROGUANIL HCL 250-100 MG PO TABS: 1.0000 | ORAL_TABLET | Freq: Every day | ORAL | 0 refills | Status: DC

## 2022-01-27 MED ORDER — CIPROFLOXACIN HCL 500 MG PO TABS: 500.0000 mg | ORAL_TABLET | Freq: Two times a day (BID) | ORAL | 0 refills | Status: AC

## 2022-01-27 MED ORDER — VIVOTIF PO CPDR: 1.0000 | DELAYED_RELEASE_CAPSULE | ORAL | 0 refills | Status: DC

## 2022-01-27 NOTE — Progress Notes (Signed)
? ?  Subjective:  ? ?Patient ID: Amber Robinson, female    DOB: Jul 09, 1976, 46 y.o.   MRN: [id number] ? ?HPI ?The patient is here for physical. ? ?PMH, [facility], social history reviewed and updated ? ?Review of Systems  ?Constitutional: Negative.   ?HENT: Negative.    ?Eyes: Negative.   ?Respiratory:  Negative for cough, chest tightness and shortness of breath.   ?Cardiovascular:  Negative for chest pain, palpitations and leg swelling.  ?Gastrointestinal:  Negative for abdominal distention, abdominal pain, constipation, diarrhea, nausea and vomiting.  ?Musculoskeletal:  Positive for back pain.  ?Skin: Negative.   ?Neurological: Negative.   ?Psychiatric/Behavioral: Negative.    ? ?Objective:  ?Physical Exam ?Constitutional:   ?   Appearance: She is well-developed.  ?HENT:  ?   Head: Normocephalic and atraumatic.  ?Cardiovascular:  ?   Rate and Rhythm: Normal rate and regular rhythm.  ?Pulmonary:  ?   Effort: Pulmonary effort is normal. No respiratory distress.  ?   Breath sounds: Normal breath sounds. No wheezing or rales.  ?Abdominal:  ?   General: Bowel sounds are normal. There is no distension.  ?   Palpations: Abdomen is soft.  ?   Tenderness: There is no abdominal tenderness. There is no rebound.  ?Musculoskeletal:  ?   Cervical back: Normal range of motion.  ?Skin: ?   General: Skin is warm and dry.  ?Neurological:  ?   Mental Status: She is alert and oriented to person, place, and time.  ?   Coordination: Coordination normal.  ? ? ?Vitals:  ? 01/27/22 0811  ?BP: 108/70  ?Pulse: 92  ?Resp: 18  ?SpO2: 99%  ?Weight: 164 lb 9.6 oz (74.7 kg)  ?Height: 5' 4"  (1.626 m)  ? ? ?This visit occurred during the SARS-CoV-2 public health emergency.  Safety protocols were in place, including screening questions prior to the visit, additional usage of staff PPE, and extensive cleaning of exam room while observing appropriate contact time as indicated for disinfecting solutions.  ? ?Assessment & Plan:  ? ?

## 2022-01-27 NOTE — Assessment & Plan Note (Signed)
Uses pepcid prn only. Well controlled will continue. ?

## 2022-01-27 NOTE — Assessment & Plan Note (Signed)
Flu shot yearly. Covid-19 counseled. Tetanus up to date. Cologuard ordered. Mammogram up to date with gyn, pap smear up to date with gyn. Counseled about sun safety and mole surveillance. Counseled about the dangers of distracted driving. Given 10 year screening recommendations.  ? ?

## 2022-01-27 NOTE — Patient Instructions (Addendum)
We have sent in the typhoid vaccine which is a capsule to take 1 pill every other day for all 4 pills. Finish this at least a week prior to travel. ? ?We have sent in the antibiotic to take with you on your trip. ? ?We have sent in the anti-malaria medicine malarone to take 1 pill daily starting 2 days prior to leaving on trip, during trip, and then for 1 week after returning from trip. ? ?We will have the cologuard to the house to do colon cancer screening. ? ? ?

## 2022-01-27 NOTE — Assessment & Plan Note (Signed)
Had episode in April which is now resolved. Referral to PT for preventative strategies and exercise to help prevent reoccurrence. She has had several flares.  ?

## 2022-01-27 NOTE — Assessment & Plan Note (Signed)
Traveling to home country [place] in July. Needs updated typhoid vaccine (rx done today and instructions given) as well as antibiotic to take in case of traveler's diarrhea (done rx today cipro) and anti-malaria malarone (start 2 days prior to trip, continue on trip, take additional week once home).  ?

## 2022-02-24 LAB — COLOGUARD: COLOGUARD: NEGATIVE

## 2022-03-29 ENCOUNTER — Encounter: Payer: Self-pay | Admitting: Internal Medicine

## 2022-03-31 MED ORDER — VIVOTIF PO CPDR: 1.0000 | DELAYED_RELEASE_CAPSULE | ORAL | 0 refills | Status: DC

## 2022-03-31 MED ORDER — ATOVAQUONE-PROGUANIL HCL 250-100 MG PO TABS: 1.0000 | ORAL_TABLET | Freq: Every day | ORAL | 0 refills | Status: DC

## 2022-03-31 MED ORDER — AZITHROMYCIN 250 MG PO TABS: ORAL_TABLET | ORAL | 0 refills | Status: AC

## 2022-10-13 LAB — HM MAMMOGRAPHY

## 2022-11-21 ENCOUNTER — Other Ambulatory Visit: Payer: Self-pay

## 2022-11-21 ENCOUNTER — Encounter (HOSPITAL_BASED_OUTPATIENT_CLINIC_OR_DEPARTMENT_OTHER): Payer: Self-pay

## 2022-11-21 ENCOUNTER — Emergency Department (HOSPITAL_BASED_OUTPATIENT_CLINIC_OR_DEPARTMENT_OTHER)
Admission: EM | Admit: 2022-11-21 | Discharge: 2022-11-21 | Disposition: A | Discharge status: Home / Self Care | Payer: 59 | Attending: Emergency Medicine | Admitting: Emergency Medicine

## 2022-11-21 DIAGNOSIS — J101 Influenza due to other identified influenza virus with other respiratory manifestations: Secondary | ICD-10-CM | POA: Insufficient documentation

## 2022-11-21 DIAGNOSIS — Z20822 Contact with and (suspected) exposure to covid-19: Secondary | ICD-10-CM | POA: Insufficient documentation

## 2022-11-21 DIAGNOSIS — R059 Cough, unspecified: Secondary | ICD-10-CM | POA: Diagnosis present

## 2022-11-21 LAB — RESP PANEL BY RT-PCR (RSV, FLU A&B, COVID)  RVPGX2
Influenza A by PCR: NEGATIVE
Influenza B by PCR: POSITIVE — AB
Resp Syncytial Virus by PCR: NEGATIVE
SARS Coronavirus 2 by RT PCR: NEGATIVE

## 2022-11-21 MED ORDER — BENZONATATE 100 MG PO CAPS: 100.0000 mg | ORAL_CAPSULE | Freq: Three times a day (TID) | ORAL | 0 refills | Status: DC

## 2022-11-21 MED ORDER — PROMETHAZINE-DM 6.25-15 MG/5ML PO SYRP: 5.0000 mL | ORAL_SOLUTION | Freq: Four times a day (QID) | ORAL | 0 refills | Status: DC | PRN

## 2022-11-21 NOTE — ED Triage Notes (Signed)
Patient here POV from Home.  Endorses Family member having Flu and COVID-19 approximately 1 Week ago and then she became ill with Cough. Has been taking Tessalon, Amoxicillin, and Tamiflu but seeks evaluation today as her Cough became Productive and has not become better.  Fevers have subsided.   NAD Noted during Triage. A&Ox4. GCS 15. Ambulatory.

## 2022-11-21 NOTE — ED Provider Notes (Signed)
[facility] Provider Note   CSN: [id number] Arrival date & time: 11/21/22  1116     History  Chief Complaint  Patient presents with   Cough    Amber Robinson is a 47 y.o. female.  The history is provided by the patient, the spouse and medical records. No language interpreter was used.  Cough    48 year old female with significant history of GERD, presenting to the ED with flulike symptoms.  Patient report a week ago her daughter test positive for both COVID and flu.  The next day she developed basically the same symptoms including fever, chills, body aches, decreased appetite, congestion, runny nose, sneezing, coughing, sore throat.  She was given Tamiflu for which she finished with the medication without any relief.  She continues to cough quite a bit and cough is now productive with yellow sputum.  She was also prescribed Augmentin that she started taking it yesterday but with no improvement of symptoms she decided to come here for further assessment.  She denies any significant shortness of breath or dysuria.    Home Medications Prior to Admission medications   Medication Sig Start Date End Date Taking? Authorizing Provider  atovaquone-proguanil (MALARONE) 250-100 MG TABS tablet Take 1 tablet by mouth daily. 03/31/22   Hoyt Koch, MD  famotidine (PEPCID) 40 MG tablet Take 1 tablet (40 mg total) by mouth daily. 01/21/21   Hoyt Koch, MD  levonorgestrel (LILETTA, 52 MG,) 20.1 MCG/DAY IUD 1 each by Intrauterine route once.    [provider]  typhoid (VIVOTIF) DR capsule Take 1 capsule by mouth every other day. 03/31/22   Hoyt Koch, MD      Allergies    Patient has no known allergies.    Review of Systems   Review of Systems  Respiratory:  Positive for cough.   All other systems reviewed and are negative.   Physical Exam Updated Vital Signs BP 139/84 (BP Location: Right Arm)   Pulse  94   Temp 98 F (36.7 C) (Oral)   Resp 18   Ht '5\' 4"'$  (1.626 m)   Wt 75.8 kg   SpO2 100%   BMI 28.67 kg/m  Physical Exam Vitals and nursing note reviewed.  Constitutional:      General: She is not in acute distress.    Appearance: She is well-developed.  HENT:     Head: Atraumatic.     Right Ear: Tympanic membrane normal.     Left Ear: Tympanic membrane normal.     Nose: Nose normal.     Mouth/Throat:     Mouth: Mucous membranes are moist.  Eyes:     Conjunctiva/sclera: Conjunctivae normal.  Cardiovascular:     Rate and Rhythm: Normal rate and regular rhythm.  Pulmonary:     Effort: Pulmonary effort is normal.     Breath sounds: No wheezing, rhonchi or rales.  Abdominal:     Palpations: Abdomen is soft.     Tenderness: There is no abdominal tenderness.  Musculoskeletal:     Cervical back: Neck supple.  Skin:    Findings: No rash.  Neurological:     Mental Status: She is alert.  Psychiatric:        Mood and Affect: Mood normal.     ED Results / Procedures / Treatments   Labs (all labs ordered are listed, but only abnormal results are displayed) Labs Reviewed  RESP PANEL BY RT-PCR (RSV, FLU A&B, COVID)  RVPGX2 - Abnormal; Notable for the following components:      Result Value   Influenza B by PCR POSITIVE (*)    All other components within normal limits    EKG None  Radiology No results found.  Procedures Procedures    Medications Ordered in ED Medications - No data to display  ED Course/ Medical Decision Making/ A&P                             Medical Decision Making  BP 139/84 (BP Location: Right Arm)   Pulse 94   Temp 98 F (36.7 C) (Oral)   Resp 18   Ht '5\' 4"'$  (1.626 m)   Wt 75.8 kg   SpO2 100%   BMI 28.61 kg/m   48 year old female with significant history of GERD, presenting to the ED with flulike symptoms.  Patient report a week ago her daughter test positive for both COVID and flu.  The next day she developed basically the same  symptoms including fever, chills, body aches, decreased appetite, congestion, runny nose, sneezing, coughing, sore throat.  She was given Tamiflu for which she finished with the medication without any relief.  She continues to cough quite a bit and cough is now productive with yellow sputum.  She was also prescribed Augmentin that she started taking it yesterday but with no improvement of symptoms she decided to come here for further assessment.  She denies any significant shortness of breath or dysuria.  On exam this is a well-appearing female laying in bed appears to be in no acute discomfort.  Heart with normal rate and rhythm, lungs are clear to auscultation bilaterally abdomen is soft nontender, throat exam unremarkable.  Normal skin turgor.  Vital signs reviewed and overall reassuring, no fever, no hypoxia.    DDx: COVID, flu, RSV, pneumonia, viral illness, strep pharyngitis  Labs obtained independent viewed and interpreted by me.  Patient has positive for influenza B consistent with her presentation.  I have considered imaging including chest x-ray but patient just recently started on Augmentin which should cover for pneumonia therefore I felt additional imaging is not indicated and family agrees.  Patient has been taking Tessalon at home without adequate relief for her cough, will prescribe dextromethorphan cough medication for symptom control.  Return precaution given.  Otherwise patient stable for discharge.  Low suspicion for PE.  She is PERC negative.        Final Clinical Impression(s) / ED Diagnoses Final diagnoses:  Influenza B    Rx / DC Orders ED Discharge Orders          Ordered    promethazine-dextromethorphan (PROMETHAZINE-DM) 6.25-15 MG/5ML syrup  4 times daily PRN        11/21/22 1246    benzonatate (TESSALON) 100 MG capsule  Every 8 hours        11/21/22 1246              Domenic Moras, PA-C 11/21/22 1247    Trifan, Carola Rhine, MD 11/21/22 [id number]

## 2022-11-21 NOTE — Discharge Instructions (Addendum)
You have tested positive for influenza B likely the cause of your symptoms.  You may continue to take Tessalon as needed for your cough.  You may also take dextromethorphan for cough as well.  Continue to take Augmentin to cover for potential superimposed pneumonia.  Return if you have any concern.

## 2023-01-29 ENCOUNTER — Encounter: Payer: Self-pay | Admitting: Internal Medicine

## 2023-01-29 ENCOUNTER — Ambulatory Visit (INDEPENDENT_AMBULATORY_CARE_PROVIDER_SITE_OTHER): Payer: 59 | Admitting: Internal Medicine

## 2023-01-29 VITALS — BP 118/64 | HR 86 | Temp 98.7°F | Ht 64.0 in | Wt 171.0 lb

## 2023-01-29 DIAGNOSIS — Z0000 Encounter for general adult medical examination without abnormal findings: Secondary | ICD-10-CM | POA: Diagnosis not present

## 2023-01-29 DIAGNOSIS — K219 Gastro-esophageal reflux disease without esophagitis: Secondary | ICD-10-CM | POA: Diagnosis not present

## 2023-01-29 DIAGNOSIS — Z7184 Encounter for health counseling related to travel: Secondary | ICD-10-CM | POA: Diagnosis not present

## 2023-01-29 NOTE — Assessment & Plan Note (Signed)
May go again to [place] this year. Advised to message [place] for medication. She is up to date on typhoid and will send in anti-malaria and cipro prior to try.

## 2023-01-29 NOTE — Progress Notes (Signed)
   Subjective:   Patient ID: Amber Robinson, female    DOB: 06-06-1976, 47 y.o.   MRN: [id number]  HPI The patient is here for physical.  PMH, [facility], social history reviewed and updated  Review of Systems  Constitutional: Negative.   HENT: Negative.    Eyes: Negative.   Respiratory:  Negative for cough, chest tightness and shortness of breath.   Cardiovascular:  Negative for chest pain, palpitations and leg swelling.  Gastrointestinal:  Negative for abdominal distention, abdominal pain, constipation, diarrhea, nausea and vomiting.  Musculoskeletal: Negative.   Skin: Negative.   Neurological: Negative.   Psychiatric/Behavioral: Negative.      Objective:  Physical Exam Constitutional:      Appearance: She is well-developed.  HENT:     Head: Normocephalic and atraumatic.  Cardiovascular:     Rate and Rhythm: Normal rate and regular rhythm.  Pulmonary:     Effort: Pulmonary effort is normal. No respiratory distress.     Breath sounds: Normal breath sounds. No wheezing or rales.  Abdominal:     General: Bowel sounds are normal. There is no distension.     Palpations: Abdomen is soft.     Tenderness: There is no abdominal tenderness. There is no rebound.  Musculoskeletal:     Cervical back: Normal range of motion.  Skin:    General: Skin is warm and dry.  Neurological:     Mental Status: She is alert and oriented to person, place, and time.     Coordination: Coordination normal.     Vitals:   01/29/23 0804  BP: 118/64  Pulse: 86  Temp: 98.7 F (37.1 C)  TempSrc: Oral  SpO2: 99%  Weight: 171 lb (77.6 kg)  Height: 5\' 4"  (1.626 m)    Assessment & Plan:

## 2023-01-29 NOTE — Assessment & Plan Note (Signed)
Rare and using pepcid prn. Has made dietary changes to help.

## 2023-01-29 NOTE — Assessment & Plan Note (Signed)
Flu shot yearly. Tetanus up to date. Cologuard up to date. Mammogram up to date, pap smear up to date with gyn. Counseled about sun safety and mole surveillance. Counseled about the dangers of distracted driving. Given 10 year screening recommendations.

## 2024-01-02 IMAGING — DX DG CERVICAL SPINE COMPLETE 4+V
5 series · 5 of 5 positions shown · non-contrast
Comparison: None.

CLINICAL DATA: Neck pain x1 week

EXAM:
CERVICAL SPINE - COMPLETE 4+ VIEW

[c-spine lat]
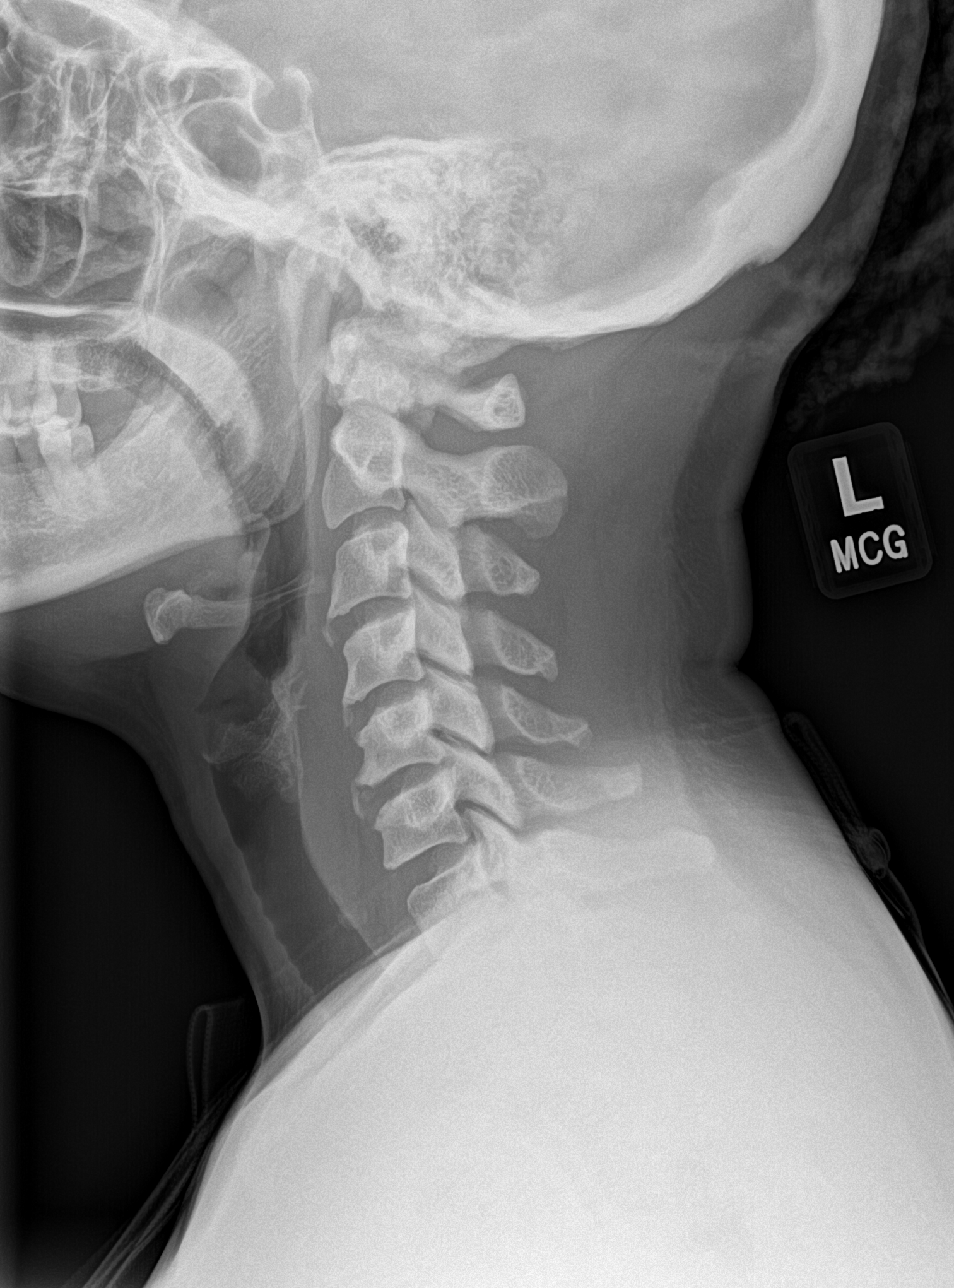

[c-spine obl (1 of 2)]
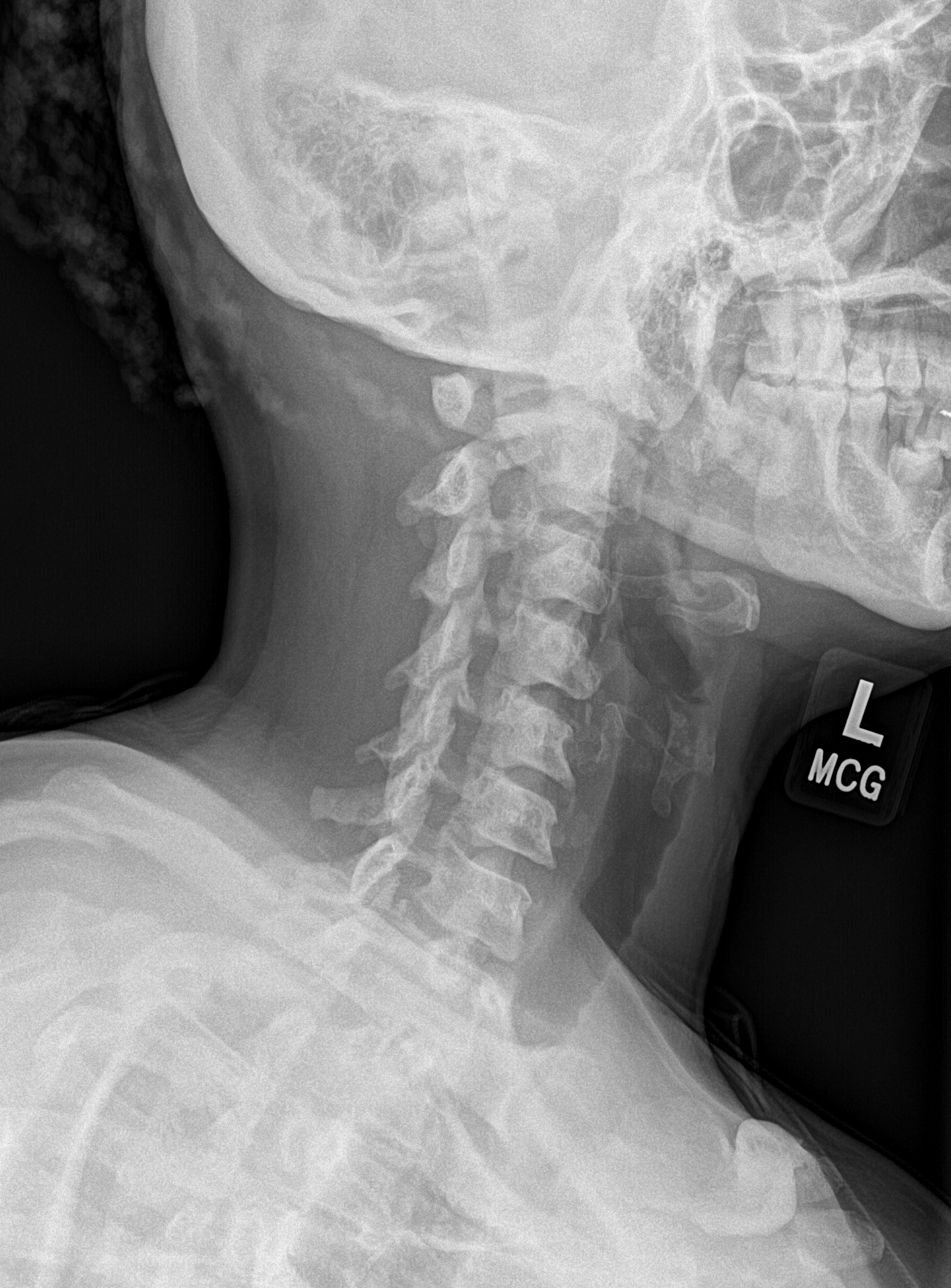

[c-spine obl (2 of 2)]
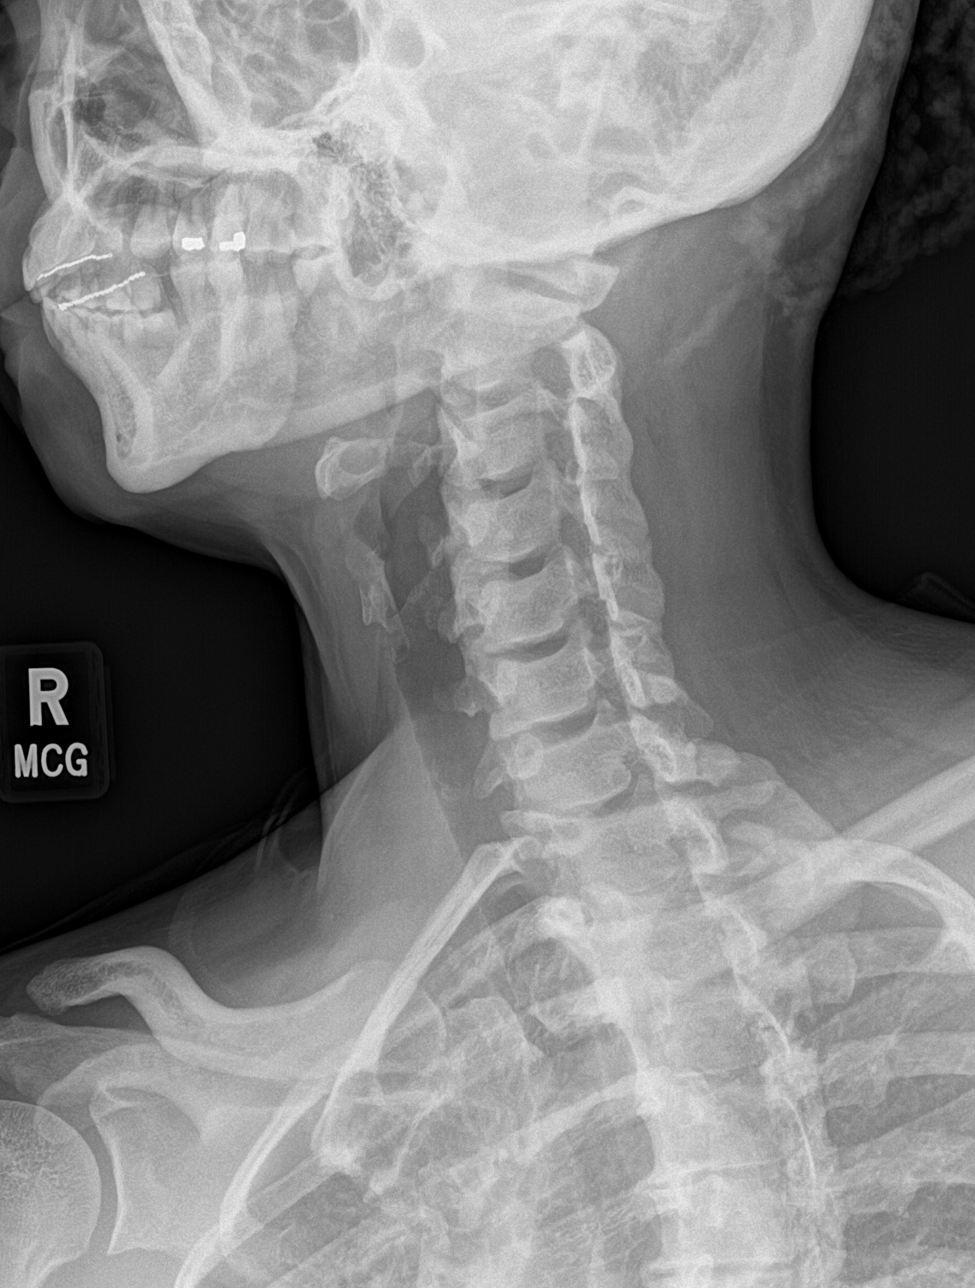

[c-spine ap]
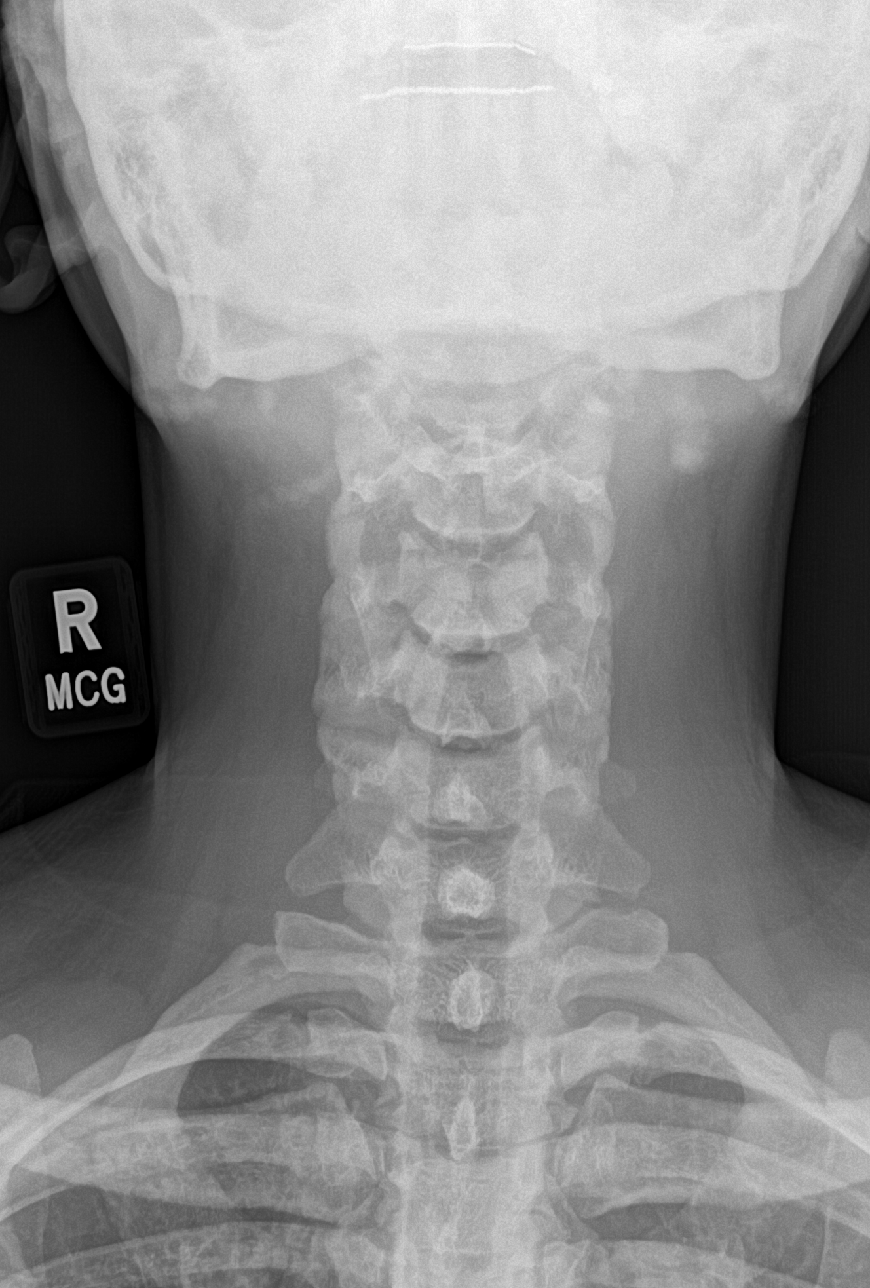

[c-spine open mouth]
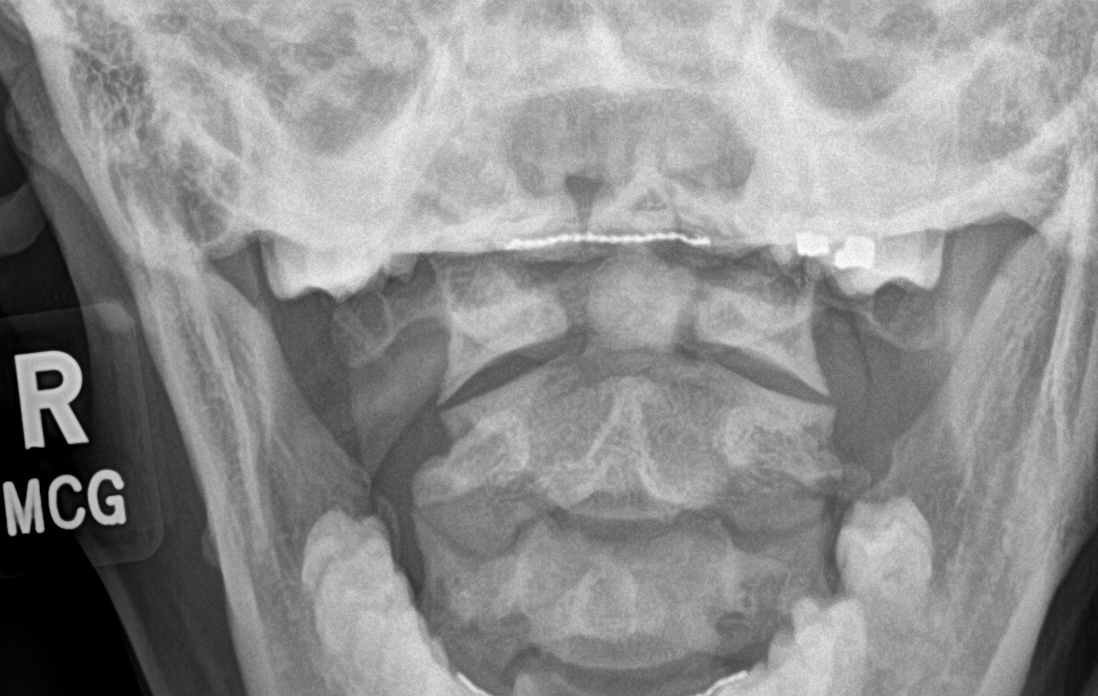

[5 of 5 positions shown; findings below may reference images not displayed]

FINDINGS: No recent fracture or dislocation is seen. There is calcification in
the anterior spinal ligament from C3-C6 levels. There is mild
encroachment of neural foramina at C3-C4, C5-C6 and C6-C7 levels.
Prevertebral soft tissues are unremarkable.
IMPRESSION: No recent fracture is seen in the cervical spine. Cervical
spondylosis with encroachment of neural foramina at C3-C4, C5-C6 and
C6-C7 levels.

## 2024-02-01 ENCOUNTER — Ambulatory Visit (INDEPENDENT_AMBULATORY_CARE_PROVIDER_SITE_OTHER): Payer: 59 | Admitting: Internal Medicine

## 2024-02-01 ENCOUNTER — Encounter: Payer: Self-pay | Admitting: Internal Medicine

## 2024-02-01 VITALS — BP 122/80 | HR 79 | Temp 98.4°F | Ht 64.0 in | Wt 174.0 lb

## 2024-02-01 DIAGNOSIS — R42 Dizziness and giddiness: Secondary | ICD-10-CM

## 2024-02-01 DIAGNOSIS — Z0000 Encounter for general adult medical examination without abnormal findings: Secondary | ICD-10-CM

## 2024-02-01 DIAGNOSIS — Z1159 Encounter for screening for other viral diseases: Secondary | ICD-10-CM

## 2024-02-01 LAB — COMPREHENSIVE METABOLIC PANEL WITH GFR
ALT: 11 U/L (ref 0–35)
AST: 15 U/L (ref 0–37)
Albumin: 4.4 g/dL (ref 3.5–5.2)
Alkaline Phosphatase: 65 U/L (ref 39–117)
BUN: 17 mg/dL (ref 6–23)
CO2: 28 meq/L (ref 19–32)
Calcium: 9.5 mg/dL (ref 8.4–10.5)
Chloride: 105 meq/L (ref 96–112)
Creatinine, Ser: 0.93 mg/dL (ref 0.40–1.20)
GFR: 73.4 mL/min (ref >60.00)
Glucose, Bld: 96 mg/dL (ref 70–99)
Potassium: 4.7 meq/L (ref 3.5–5.1)
Sodium: 142 meq/L (ref 135–145)
Total Bilirubin: 0.4 mg/dL (ref 0.2–1.2)
Total Protein: 7.2 g/dL (ref 6.0–8.3)

## 2024-02-01 LAB — LIPID PANEL
Cholesterol: 151 mg/dL (ref 0–200)
HDL: 46.7 mg/dL (ref >39.00)
LDL Cholesterol: 92 mg/dL (ref 0–99)
NonHDL: 104.4
Total CHOL/HDL Ratio: 3
Triglycerides: 60 mg/dL (ref 0.0–149.0)
VLDL: 12 mg/dL (ref 0.0–40.0)

## 2024-02-01 LAB — CBC
HCT: 42.7 % (ref 36.0–46.0)
Hemoglobin: 13.9 g/dL (ref 12.0–15.0)
MCHC: 32.6 g/dL (ref 30.0–36.0)
MCV: 84.2 fl (ref 78.0–100.0)
Platelets: 264 10*3/uL (ref 150.0–400.0)
RBC: 5.07 Mil/uL (ref 3.87–5.11)
RDW: 13.5 % (ref 11.5–15.5)
WBC: 5 10*3/uL (ref 4.0–10.5)

## 2024-02-01 LAB — HEMOGLOBIN A1C: Hgb A1c MFr Bld: 5.2 % (ref 4.6–6.5)

## 2024-02-01 NOTE — Progress Notes (Signed)
   Subjective:   Patient ID: Amber Robinson, female    DOB: 30-Sep-1975, 48 y.o.   MRN: [id number]  HPI The patient is here for physical. Also having new problem of dizziness in the last few months. Seems to trigger more likely with delayed meals but has happened with close to eating. Denies ear pain or hearing change or other neurological changes. Still having intermittent vision loss temporary but the dizziness is not associated with this.   PMH, [facility], social history reviewed and updated  Review of Systems  Constitutional: Negative.   HENT: Negative.    Eyes: Negative.   Respiratory:  Negative for cough, chest tightness and shortness of breath.   Cardiovascular:  Negative for chest pain, palpitations and leg swelling.  Gastrointestinal:  Negative for abdominal distention, abdominal pain, constipation, diarrhea, nausea and vomiting.  Musculoskeletal: Negative.   Skin: Negative.   Neurological:  Positive for dizziness.  Psychiatric/Behavioral: Negative.      Objective:  Physical Exam Constitutional:      Appearance: She is well-developed.  HENT:     Head: Normocephalic and atraumatic.     Right Ear: Tympanic membrane normal.     Left Ear: Tympanic membrane normal.  Neck:     Vascular: No carotid bruit.  Cardiovascular:     Rate and Rhythm: Normal rate and regular rhythm.  Pulmonary:     Effort: Pulmonary effort is normal. No respiratory distress.     Breath sounds: Normal breath sounds. No wheezing or rales.  Abdominal:     General: Bowel sounds are normal. There is no distension.     Palpations: Abdomen is soft.     Tenderness: There is no abdominal tenderness. There is no rebound.  Musculoskeletal:     Cervical back: Normal range of motion.  Skin:    General: Skin is warm and dry.  Neurological:     Mental Status: She is alert and oriented to person, place, and time.     Coordination: Coordination normal.     Vitals:   02/01/24 0819  BP: 122/80  Pulse: 79   Temp: 98.4 F (36.9 C)  TempSrc: Oral  SpO2: 94%  Weight: 174 lb (78.9 kg)  Height: 5\' 4"  (1.626 m)    Assessment & Plan:

## 2024-02-01 NOTE — Assessment & Plan Note (Signed)
 New problem and may be associated with delayed meals. Checking CBC, CMP, HgA1c, TSH, B12, vitamin D to assess. Ears are clear on exam. Prior carotid and MRI done making repeat need for imaging less likely.

## 2024-02-01 NOTE — Assessment & Plan Note (Signed)
 Flu shot yearly. Tetanus up to date. Colon cancer screening due 2026. Mammogram up to date getting records, pap smear up to date. Counseled about sun safety and mole surveillance. Counseled about the dangers of distracted driving. Given 10 year screening recommendations.

## 2024-02-02 LAB — TSH: TSH: 2.34 u[IU]/mL (ref 0.35–5.50)

## 2024-02-02 LAB — HEPATITIS C ANTIBODY: Hepatitis C Ab: NONREACTIVE

## 2024-02-02 LAB — VITAMIN B12: Vitamin B-12: 328 pg/mL (ref 211–911)

## 2024-02-02 LAB — VITAMIN D 25 HYDROXY (VIT D DEFICIENCY, FRACTURES): VITD: 22.82 ng/mL — ABNORMAL LOW (ref 30.00–100.00)

## 2024-02-09 ENCOUNTER — Ambulatory Visit: Payer: Self-pay | Admitting: Internal Medicine

## 2025-02-03 ENCOUNTER — Encounter: Admitting: Internal Medicine
